# Patient Record
Sex: Female | Born: 1965 | Race: White | Hispanic: No | Marital: Married | Smoking: Former smoker
Health system: Southern US, Community
[De-identification: ages and names within clinical notes are randomized; demographics above are authoritative.]

## PROBLEM LIST (undated history)

## (undated) DIAGNOSIS — I10 Essential (primary) hypertension: Secondary | ICD-10-CM

## (undated) DIAGNOSIS — D61818 Other pancytopenia: Secondary | ICD-10-CM

## (undated) DIAGNOSIS — C9100 Acute lymphoblastic leukemia not having achieved remission: Secondary | ICD-10-CM

## (undated) HISTORY — DX: Other pancytopenia: D61.818

## (undated) HISTORY — DX: Essential (primary) hypertension: I10

## (undated) HISTORY — DX: Acute lymphoblastic leukemia not having achieved remission: C91.00

---

## 1998-07-20 ENCOUNTER — Other Ambulatory Visit: Admission: RE | Admit: 1998-07-20 | Discharge: 1998-07-20 | Payer: Self-pay | Admitting: Obstetrics and Gynecology

## 1999-06-30 ENCOUNTER — Other Ambulatory Visit: Admission: RE | Admit: 1999-06-30 | Discharge: 1999-06-30 | Payer: Self-pay | Admitting: Obstetrics and Gynecology

## 2000-12-30 ENCOUNTER — Other Ambulatory Visit: Admission: RE | Admit: 2000-12-30 | Discharge: 2000-12-30 | Payer: Self-pay | Admitting: Obstetrics and Gynecology

## 2002-01-05 ENCOUNTER — Other Ambulatory Visit: Admission: RE | Admit: 2002-01-05 | Discharge: 2002-01-05 | Payer: Self-pay | Admitting: Obstetrics and Gynecology

## 2004-02-14 ENCOUNTER — Other Ambulatory Visit: Admission: RE | Admit: 2004-02-14 | Discharge: 2004-02-14 | Payer: Self-pay | Admitting: Obstetrics and Gynecology

## 2005-04-17 ENCOUNTER — Other Ambulatory Visit: Admission: RE | Admit: 2005-04-17 | Discharge: 2005-04-17 | Payer: Self-pay | Admitting: Obstetrics and Gynecology

## 2005-09-14 ENCOUNTER — Ambulatory Visit (HOSPITAL_COMMUNITY): Admission: RE | Admit: 2005-09-14 | Discharge: 2005-09-14 | Payer: Self-pay | Admitting: Obstetrics and Gynecology

## 2006-04-18 ENCOUNTER — Other Ambulatory Visit: Admission: RE | Admit: 2006-04-18 | Discharge: 2006-04-18 | Payer: Self-pay | Admitting: Obstetrics and Gynecology

## 2006-09-16 ENCOUNTER — Ambulatory Visit (HOSPITAL_COMMUNITY): Admission: RE | Admit: 2006-09-16 | Discharge: 2006-09-16 | Payer: Self-pay | Admitting: Obstetrics and Gynecology

## 2007-09-30 ENCOUNTER — Ambulatory Visit (HOSPITAL_COMMUNITY): Admission: RE | Admit: 2007-09-30 | Discharge: 2007-09-30 | Payer: Self-pay | Admitting: Obstetrics and Gynecology

## 2008-08-03 ENCOUNTER — Other Ambulatory Visit: Admission: RE | Admit: 2008-08-03 | Discharge: 2008-08-03 | Payer: Self-pay | Admitting: Obstetrics and Gynecology

## 2009-08-04 ENCOUNTER — Other Ambulatory Visit: Admission: RE | Admit: 2009-08-04 | Discharge: 2009-08-04 | Payer: Self-pay | Admitting: Obstetrics and Gynecology

## 2009-09-02 ENCOUNTER — Ambulatory Visit (HOSPITAL_COMMUNITY): Admission: RE | Admit: 2009-09-02 | Discharge: 2009-09-02 | Payer: Self-pay | Admitting: Internal Medicine

## 2009-12-26 ENCOUNTER — Encounter: Admission: RE | Admit: 2009-12-26 | Discharge: 2009-12-26 | Payer: Self-pay | Admitting: General Practice

## 2010-09-15 ENCOUNTER — Ambulatory Visit (HOSPITAL_COMMUNITY): Admission: RE | Admit: 2010-09-15 | Discharge: 2010-09-15 | Payer: Self-pay | Admitting: Internal Medicine

## 2011-09-10 ENCOUNTER — Other Ambulatory Visit (HOSPITAL_COMMUNITY): Payer: Self-pay | Admitting: Internal Medicine

## 2011-09-10 DIAGNOSIS — Z1231 Encounter for screening mammogram for malignant neoplasm of breast: Secondary | ICD-10-CM

## 2011-10-10 ENCOUNTER — Ambulatory Visit (HOSPITAL_COMMUNITY)
Admission: RE | Admit: 2011-10-10 | Discharge: 2011-10-10 | Disposition: A | Discharge status: Home / Self Care | Payer: BC Managed Care – PPO | Source: Ambulatory Visit | Attending: Internal Medicine | Admitting: Internal Medicine

## 2011-10-10 DIAGNOSIS — Z1231 Encounter for screening mammogram for malignant neoplasm of breast: Secondary | ICD-10-CM | POA: Insufficient documentation

## 2012-07-06 ENCOUNTER — Emergency Department (HOSPITAL_COMMUNITY): Payer: BC Managed Care – PPO

## 2012-07-06 ENCOUNTER — Encounter (HOSPITAL_COMMUNITY): Payer: Self-pay | Admitting: Emergency Medicine

## 2012-07-06 ENCOUNTER — Emergency Department (HOSPITAL_COMMUNITY)
Admission: EM | Admit: 2012-07-06 | Discharge: 2012-07-06 | Disposition: A | Discharge status: Home / Self Care | Payer: BC Managed Care – PPO | Attending: Emergency Medicine | Admitting: Emergency Medicine

## 2012-07-06 DIAGNOSIS — Z87891 Personal history of nicotine dependence: Secondary | ICD-10-CM | POA: Insufficient documentation

## 2012-07-06 DIAGNOSIS — I10 Essential (primary) hypertension: Secondary | ICD-10-CM | POA: Insufficient documentation

## 2012-07-06 DIAGNOSIS — Z79899 Other long term (current) drug therapy: Secondary | ICD-10-CM | POA: Insufficient documentation

## 2012-07-06 DIAGNOSIS — R079 Chest pain, unspecified: Secondary | ICD-10-CM | POA: Insufficient documentation

## 2012-07-06 DIAGNOSIS — R791 Abnormal coagulation profile: Secondary | ICD-10-CM | POA: Insufficient documentation

## 2012-07-06 HISTORY — DX: Essential (primary) hypertension: I10

## 2012-07-06 LAB — BASIC METABOLIC PANEL
BUN: 13 mg/dL (ref 6–23)
CO2: 23 mEq/L (ref 19–32)
Calcium: 9.4 mg/dL (ref 8.4–10.5)
Creatinine, Ser: 0.73 mg/dL (ref 0.50–1.10)
GFR calc Af Amer: >90 mL/min (ref >90)
GFR calc non Af Amer: >90 mL/min (ref >90)
Potassium: 3.9 mEq/L (ref 3.5–5.1)
Sodium: 133 mEq/L — ABNORMAL LOW (ref 135–145)

## 2012-07-06 LAB — POCT I-STAT TROPONIN I: Troponin i, poc: 0 ng/mL (ref 0.00–0.08)

## 2012-07-06 LAB — CBC
HCT: 37.1 % (ref 36.0–46.0)
Hemoglobin: 13.3 g/dL (ref 12.0–15.0)
MCH: 31.5 pg (ref 26.0–34.0)
MCHC: 35.8 g/dL (ref 30.0–36.0)
MCV: 87.9 fL (ref 78.0–100.0)
Platelets: 108 10*3/uL — ABNORMAL LOW (ref 150–400)
RBC: 4.22 MIL/uL (ref 3.87–5.11)
RDW: 15 % (ref 11.5–15.5)

## 2012-07-06 LAB — D-DIMER, QUANTITATIVE: D-Dimer, Quant: 0.83 ug/mL-FEU — ABNORMAL HIGH (ref 0.00–0.48)

## 2012-07-06 MED ORDER — ASPIRIN 325 MG PO TABS
325.0000 mg | ORAL_TABLET | ORAL | Status: AC
Start: 2012-07-06 — End: 2012-07-06
  Administered 2012-07-06: 325 mg via ORAL
  Filled 2012-07-06: qty 1

## 2012-07-06 MED ORDER — OXYCODONE-ACETAMINOPHEN 5-325 MG PO TABS
1.0000 | ORAL_TABLET | Freq: Once | ORAL | Status: AC
  Administered 2012-07-06: 1 via ORAL
  Filled 2012-07-06: qty 1

## 2012-07-06 MED ORDER — KETOROLAC TROMETHAMINE 30 MG/ML IJ SOLN
30.0000 mg | Freq: Once | INTRAMUSCULAR | Status: AC
  Administered 2012-07-06: 30 mg via INTRAVENOUS
  Filled 2012-07-06: qty 1

## 2012-07-06 MED ORDER — ONDANSETRON HCL 4 MG/2ML IJ SOLN
4.0000 mg | Freq: Once | INTRAMUSCULAR | Status: AC
  Administered 2012-07-06: 4 mg via INTRAVENOUS

## 2012-07-06 MED ORDER — IOHEXOL 350 MG/ML SOLN
100.0000 mL | Freq: Once | INTRAVENOUS | Status: AC | PRN
  Administered 2012-07-06: 100 mL via INTRAVENOUS

## 2012-07-06 MED ORDER — SODIUM CHLORIDE 0.9 % IV BOLUS (SEPSIS)
1000.0000 mL | Freq: Once | INTRAVENOUS | Status: AC
  Administered 2012-07-06: 1000 mL via INTRAVENOUS

## 2012-07-06 MED ORDER — ONDANSETRON HCL 4 MG/2ML IJ SOLN
INTRAMUSCULAR | Status: AC
  Filled 2012-07-06: qty 2

## 2012-07-06 NOTE — ED Notes (Signed)
Pt states that last night around 11 pm, she went to bed and noticed that her chest felt uncomfortable, pt took a few motrin and went to bed but was woken up around 2 am with sharp pain across central chest, radiating to the back.  Pt had a similar pain in July of this year and pain lasted for a week and gradually got better.  Pt has HTN but no other cardiac hx.  Denies NV, SOB  Not diaphoretic.

## 2012-07-06 NOTE — ED Provider Notes (Signed)
History     CSN: [id number]  Arrival date & time 07/06/12  1220   First MD Initiated Contact with Patient 07/06/12 1317      Chief Complaint  Patient presents with  . Chest Pain    (Consider location/radiation/quality/duration/timing/severity/associated sxs/prior treatment) The history is provided by the patient.  Samantha Oconnor is a 46 y.o. female hx of HTN here with CP. CP started around 11pm last night, it felt band-like around her chest. Not pleuritic or exertional. She had a similar episode in July that resolved with motrin. She recently traveled to [place] by car but denies leg swelling or SOB. No fever or chills or cough. She had no cardiac workup in the past. Her only cardiac risk factor is HTN.    Past Medical History  Diagnosis Date  . Hypertension     History reviewed. No pertinent past surgical history.  History reviewed. No pertinent family history.  History  Substance Use Topics  . Smoking status: Former Smoker -- 18 years  . Smokeless tobacco: Never Used  . Alcohol Use: 3.0 oz/week    5 Glasses of wine per week    OB History    Grav Para Term Preterm Abortions TAB SAB Ect Mult Living                  Review of Systems  Cardiovascular: Positive for chest pain.    Allergies  Bactrim  Home Medications   Current Outpatient Rx  Name Route Sig Dispense Refill  . ALISKIREN FUMARATE 300 MG PO TABS Oral Take 300 mg by mouth daily.    Marland Kitchen HYDROCHLOROTHIAZIDE 25 MG PO TABS Oral Take 25 mg by mouth daily.      BP 140/91  Pulse 99  Temp 99 F (37.2 C) (Oral)  Resp 16  Ht 5\' 3"  (1.6 m)  Wt 171 lb (77.565 kg)  BMI 30.29 kg/m2  SpO2 97%  LMP 06/30/2012  Physical Exam  Nursing note and vitals reviewed. Constitutional: She is oriented to person, place, and time.       Uncomfortable.   HENT:  Head: Normocephalic.  Mouth/Throat: Oropharynx is clear and moist.  Eyes: Conjunctivae are normal. Pupils are equal, round, and reactive to light.    Neck: Normal range of motion. Neck supple.  Cardiovascular: Normal rate, regular rhythm and normal heart sounds.   Pulmonary/Chest: Effort normal and breath sounds normal. She has no wheezes. She has no rales.       ? Tenderness on R side of chest  Abdominal: Soft. Bowel sounds are normal.  Musculoskeletal: Normal range of motion. She exhibits no edema.       No calf tenderness  Neurological: She is alert and oriented to person, place, and time.  Skin: Skin is warm and dry.  Psychiatric: She has a normal mood and affect. Her behavior is normal. Judgment and thought content normal.    ED Course  Procedures (including critical care time)  Labs Reviewed  CBC - Abnormal; Notable for the following:    WBC 1.6 (*)     Platelets 108 (*)     All other components within normal limits  BASIC METABOLIC PANEL - Abnormal; Notable for the following:    Sodium 133 (*)     Chloride 95 (*)     Glucose, Bld 119 (*)     All other components within normal limits  D-DIMER, QUANTITATIVE - Abnormal; Notable for the following:    D-Dimer, Quant 0.83 (*)  All other components within normal limits  POCT I-STAT TROPONIN I   Dg Chest 2 View  07/06/2012  *RADIOLOGY REPORT*  Clinical Data: Chest pain.  CHEST - 2 VIEW  Comparison: Chest CT, same date.  Findings: The cardiac silhouette, mediastinal and hilar contours are normal.  The lungs are clear.  No pleural effusion.  The bony thorax is intact.  Moderate lower thoracic and lumbar scoliosis.  IMPRESSION: No acute cardiopulmonary findings.   Original Report Authenticated By: P. Loralie Champagne, M.D.    Ct Angio Chest Pe W/cm &/or Wo Cm  07/06/2012  *RADIOLOGY REPORT*  Clinical Data: Chest pain.  CT ANGIOGRAPHY CHEST  Technique:  Multidetector CT imaging of the chest using the standard protocol during bolus administration of intravenous contrast. Multiplanar reconstructed images including MIPs were obtained and reviewed to evaluate the vascular anatomy.   Contrast: OMNIPAQUE IOHEXOL 350 MG/ML SOLN  Comparison: None  Findings: The chest wall is unremarkable.  No breast masses, supraclavicular or axillary adenopathy.  Small scattered nodes are noted.  The bony thorax is intact.  No destructive bone lesions or spinal canal compromise. There are scattered sclerotic areas in the thoracic spine.  I think most of this is due to degenerative disc disease and degenerative facet disease with discogenic sclerosis and reactive changes.  Sclerotic metastatic lesions are felt to be unlikely.  The heart is normal in size.  No pericardial effusion.  No mediastinal or hilar adenopathy.  The aorta is normal in caliber. No dissection.  The esophagus is grossly normal.  The pulmonary arterial tree is fairly well opacified.  No definite filling defects to suggest pulmonary emboli.  Examination of the lung parenchyma demonstrates no acute pulmonary findings.  No infiltrates, edema or effusions.  Patchy areas of atelectasis are noted.  No worrisome masses or nodules.  The tracheobronchial tree is unremarkable.  The upper abdomen is unremarkable.  IMPRESSION:  1.  No CT findings for pulmonary embolism. 2.  Normal thoracic aorta. 3.  Scattered sclerotic bony changes in the thoracic spine likely due to disc disease and facet disease with endplate reactive changes and sclerosis. 4.  No acute pulmonary findings.   Original Report Authenticated By: P. Loralie Champagne, M.D.      1. Chest pain      Date: 07/06/2012  Rate: 116  Rhythm: sinus tachycardia  QRS Axis: normal  Intervals: normal  ST/T Wave abnormalities: normal  Conduction Disutrbances:none  Narrative Interpretation:   Old EKG Reviewed: none available    MDM  Samantha Oconnor is a 46 y.o. female hx of HTN here with atypical chest pain. Will consider ACS, but given recent travel and tachycardia, will need d-dimer to r/o PE. Will check cbc, bmp, trop x 2, cxr and reevaluate.    3:33 PM Patient feels well. Now  pain free. D-dimer elevated, CT PA showed no PE. Trop neg x 1. CXR nl. I signed out to Dr. Lynelle Doctor to f/u second troponin result.       Richardean Canal, MD 07/06/12 1534

## 2012-07-23 ENCOUNTER — Institutional Professional Consult (permissible substitution): Payer: BC Managed Care – PPO | Admitting: Cardiovascular Disease

## 2012-08-26 ENCOUNTER — Telehealth: Payer: Self-pay | Admitting: Oncology

## 2012-08-26 NOTE — Telephone Encounter (Signed)
LVOM for pt to return call in re referral from Dr. Renae Fickle.

## 2012-08-27 ENCOUNTER — Telehealth: Payer: Self-pay | Admitting: Oncology

## 2012-08-27 ENCOUNTER — Encounter: Payer: Self-pay | Admitting: Oncology

## 2012-08-27 ENCOUNTER — Ambulatory Visit: Payer: BC Managed Care – PPO

## 2012-08-27 ENCOUNTER — Other Ambulatory Visit (HOSPITAL_BASED_OUTPATIENT_CLINIC_OR_DEPARTMENT_OTHER): Payer: BC Managed Care – PPO | Admitting: Lab

## 2012-08-27 ENCOUNTER — Ambulatory Visit (HOSPITAL_BASED_OUTPATIENT_CLINIC_OR_DEPARTMENT_OTHER): Payer: BC Managed Care – PPO | Admitting: Oncology

## 2012-08-27 VITALS — BP 155/95 | HR 113 | Temp 98.0°F | Resp 20 | Ht 63.0 in | Wt 159.7 lb

## 2012-08-27 DIAGNOSIS — R599 Enlarged lymph nodes, unspecified: Secondary | ICD-10-CM

## 2012-08-27 DIAGNOSIS — D61818 Other pancytopenia: Secondary | ICD-10-CM

## 2012-08-27 DIAGNOSIS — R799 Abnormal finding of blood chemistry, unspecified: Secondary | ICD-10-CM

## 2012-08-27 DIAGNOSIS — I10 Essential (primary) hypertension: Secondary | ICD-10-CM

## 2012-08-27 DIAGNOSIS — M949 Disorder of cartilage, unspecified: Secondary | ICD-10-CM

## 2012-08-27 DIAGNOSIS — R948 Abnormal results of function studies of other organs and systems: Secondary | ICD-10-CM

## 2012-08-27 DIAGNOSIS — M899 Disorder of bone, unspecified: Secondary | ICD-10-CM

## 2012-08-27 HISTORY — DX: Other pancytopenia: D61.818

## 2012-08-27 HISTORY — DX: Essential (primary) hypertension: I10

## 2012-08-27 LAB — COMPREHENSIVE METABOLIC PANEL (CC13)
ALT: 9 U/L (ref 0–55)
AST: 10 U/L (ref 5–34)
Albumin: 3.6 g/dL (ref 3.5–5.0)
Alkaline Phosphatase: 72 U/L (ref 40–150)
Potassium: 3.6 mEq/L (ref 3.5–5.1)
Sodium: 139 mEq/L (ref 136–145)
Total Bilirubin: 0.51 mg/dL (ref 0.20–1.20)
Total Protein: 7 g/dL (ref 6.4–8.3)

## 2012-08-27 LAB — MANUAL DIFFERENTIAL
Band Neutrophils: 1 % (ref 0–10)
Basophil: 0 % (ref 0–2)
Blasts: 8 % — ABNORMAL HIGH (ref 0–0)
EOS: 0 % (ref 0–7)
LYMPH: 22 % (ref 14–49)
MONO: 0 % (ref 0–14)
Metamyelocytes: 2 % — ABNORMAL HIGH (ref 0–0)
nRBC: 7 % — ABNORMAL HIGH (ref 0–0)

## 2012-08-27 LAB — CBC WITH DIFFERENTIAL/PLATELET
MCHC: 34.6 g/dL (ref 31.5–36.0)
MCV: 96.4 fL (ref 79.5–101.0)
Platelets: 73 10*3/uL — ABNORMAL LOW (ref 145–400)
RBC: 2.65 10*6/uL — ABNORMAL LOW (ref 3.70–5.45)
RDW: 19 % — ABNORMAL HIGH (ref 11.2–14.5)
WBC: 1.5 10*3/uL — ABNORMAL LOW (ref 3.9–10.3)

## 2012-08-27 NOTE — Telephone Encounter (Deleted)
C/D 08/27/12 for appt 09/04/12

## 2012-08-27 NOTE — Telephone Encounter (Signed)
Called pt regarding BMBX tomorrow to be here at 0830am

## 2012-08-27 NOTE — Telephone Encounter (Signed)
C/D 08/27/12 for appt 08/27/12

## 2012-08-27 NOTE — Progress Notes (Signed)
Checked in new pt with no financial concerns. °

## 2012-08-27 NOTE — Patient Instructions (Signed)
Return at 8:30 AM tomorrow 10/31 for bone marrow biopsy MD visit to discuss results on Monday, Nov 4th at 3 PM

## 2012-08-27 NOTE — Progress Notes (Signed)
New Patient Hematology-Oncology Evaluation   Samantha Oconnor [id number] 11/04/1965 46 y.o. 08/27/2012  CC: Dr. Andi Devon; Dr. Doristine Section   Reason for referral: Unexplained bone pain, abnormal bone marrow signal on MRI of the spine, and pancytopenia   HPI:  Urgent work in evaluation for this 46 year old woman with  overall excellent health except for treated hypertension. Back in July of this year she developed rather acute onset of bilateral sternal chest pain. She took anti-inflammatory drugs for about a week and the pain resolved. In August she developed right sciatic pain while on a driving trip to [place] to visit family. The pain got so bad that she went to an emergency department. She took Motrin and prednisone to relieve her symptoms. On Labor Day weekend she woke up and again experienced diffuse pain across her anterior chest. She was evaluated in the [facility] long emergency department on September 8 where a chest x-ray was normal and CT scan of the chest was negative for any adenopathy, infiltrate, effusion, or pulmonary emboli. Electrocardiogram was normal. A CBC was done which was abnormal but not commented on. Hemoglobin was 13 hematocrit 37 white count 1600 no differential and platelet count 108,000. She was told to followup with a cardiologist. She felt her symptoms were more orthopedic in nature and set up an appointment to see Dr. Renae Fickle. Her initial visit was on September 11. 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 She was put on a program of steroids, bed rest, and moist heat. She was reevaluated on September 23 and was doing much better at that time. However at time of a followup visit on October 21, her pain had recurred and at that point an MRI of the spine was ordered. The study was done on October 26. Main finding was diffuse abnormality of bone marrow signal throughout the spine, sacrum, and  pelvis. Multiple prominent retroperitoneal and pelvic lymph nodes noted. Dr. Renae Fickle called me with these findings and asked if I would evaluate the patient for an underlying bone marrow disorder.  Blood counts have degenerated  compared with the values done in September. CBC in our office today  shows a hemoglobin of 8.8 hematocrit 25.6 MCV 96 total white count 1500, 67% neutrophils, 22% lymphocytes, in addition there are early forms reported 2% metamyelocytes 8% blasts and the presence of  nucleated red cells. I reviewed the peripheral blood film and confirmed these findings. The blasts are small with scant cytoplasm. There are teardrop red cell forms. Some abnormal platelet forms and I confirm the presence of nucleated red blood cells. Serum LDH is elevated at 384.  In addition to the symptoms above, she admits to profuse night sweats which started in September. She has had anorexia and a significant weight loss going from 177 pounds in September to 159 pounds today. She has noted some spontaneous bruising. Blood on the tissue when she blows her nose but no spontaneous ecchymosis, no gum bleeding, no melena, she has seen a small amount of blood on the toilet tissue but has been straining at stools due to constipation from when necessary narcotic analgesics. No hematuria. No skin rash. She has had intermittent pain on her scalp.  She is a nonsmoker. She did drink wine daily prior to this illness. She has no history of exposure to organic solvents, therapeutic dose radiation, or high-dose insecticides.  There is no family history of any blood disorder.   PMH: Past Medical History  Diagnosis Date  . Hypertension   No history of MI, ulcers, reflux, diabetes, hepatitis, yellow jaundice, malaria, mononucleosis, thyroid disease, seizure, stroke, blood clots.  No prior surgical procedures except for wisdom tooth extraction.  Allergies: Allergies  Allergen Reactions  . 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 Married. 2 twin sons aged 91 and just started college.  reports that she has quit smoking. She has never used smokeless tobacco. She reports that she drinks about 3 ounces of alcohol per week.  Family History: Father died at age 68 of esophageal cancer. Mother still alive and well at age 42. She has 3 sisters age 63, 19, and 55. 2 brothers age 47 and 53. All healthy.  Review of Systems: Constitutional symptoms: See above HEENT: No sore throat, no headache Respiratory: No cough or dyspnea Cardiovascular:  No ischemic type chest pain or palpitations Gastrointestinal ROS: Constipation from recent Percocet use.  Genito-Urinary ROS: No urinary tract symptoms. No hematuria Hematological and Lymphatic: Musculoskeletal: See above Neurologic: No headache, no change in vision, no focal weakness, no paresthesias Dermatologic: Some spontaneous bruising recently. No rash Remaining ROS negative.  Physical Exam: Blood pressure 155/95, pulse 113, temperature 98 F (36.7 C), temperature source Oral, resp. rate 20, height 5\' 3"  (1.6 m), weight 159 lb 11.2 oz (72.439 kg). Wt Readings from Last 3 Encounters:  08/27/12 159 lb 11.2 oz (72.439 kg)  07/06/12 171 lb (77.565 kg)    General appearance: Well-nourished Caucasian woman Head: Normal Neck: Full range of motion Lymph nodes: No cervical, supraclavicular, axillary, or inguinal lymphadenopathy Breasts: Not examined. Last mammogram November 2012 was normal Lungs: Clear to auscultation resonant to percussion Heart: Regular rhythm no murmur Abdominal: Soft, nontender, no mass, no organomegaly and specifically no splenomegaly even when examined in the left lateral decubitus position GU: Not examined Extremities: No edema, no calf  tenderness Neurologic: Mental status intact, cranial nerves intact, PERRLA, optic discs sharp, vessels normal, no hemorrhage or exudate, motor strength was 5 over 5, reflexes 2+ symmetric, coordination normal, sensation mildly decreased to vibration over the fingertips by tuning fork exam Skin: No rash or ecchymosis    Lab Results: Lab Results  Component Value Date   WBC 1.5* 08/27/2012   HGB 8.8* 08/27/2012   HCT 25.6* 08/27/2012   MCV 96.4 08/27/2012   PLT 73* 08/27/2012     Chemistry      Component Value Date/Time   NA 139 08/27/2012 1043   NA 133* 07/06/2012 1239   K 3.6 08/27/2012 1043   K 3.9 07/06/2012 1239   CL 100 08/27/2012 1043   CL 95* 07/06/2012 1239   CO2 25 08/27/2012 1043   CO2 23 07/06/2012 1239   BUN 30.0* 08/27/2012 1043   BUN 13 07/06/2012 1239   CREATININE 0.9 08/27/2012 1043   CREATININE 0.73 07/06/2012 1239      Component Value Date/Time   CALCIUM 9.2 08/27/2012 1043   CALCIUM 9.4 07/06/2012 1239   ALKPHOS 72 08/27/2012 1043   AST 10 08/27/2012 1043   ALT 9 08/27/2012 1043   BILITOT 0.51 08/27/2012 1043    LDH 384   Review of peripheral blood film: See history of present illness   Radiological Studies: See discussion above   Impression and Plan: 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  I will do a bone marrow aspiration and biopsy tomorrow and then have her back for discussion of results and outline a treatment plan once we establish a diagnosis. She may need CT scan of the abdomen and pelvis but I would like to wait for the bone marrow findings first before ordering this.      Levert Feinstein, MD 08/27/2012, 2:13 PM

## 2012-08-27 NOTE — Telephone Encounter (Signed)
Gave pt appt for October BMBX and November MD visit with lab

## 2012-08-28 ENCOUNTER — Other Ambulatory Visit: Payer: Self-pay | Admitting: *Deleted

## 2012-08-28 ENCOUNTER — Other Ambulatory Visit (HOSPITAL_COMMUNITY)
Admission: RE | Admit: 2012-08-28 | Discharge: 2012-08-28 | Disposition: A | Discharge status: Home / Self Care | Payer: BC Managed Care – PPO | Source: Ambulatory Visit | Attending: Oncology | Admitting: Oncology

## 2012-08-28 ENCOUNTER — Ambulatory Visit: Payer: BC Managed Care – PPO

## 2012-08-28 ENCOUNTER — Encounter (HOSPITAL_BASED_OUTPATIENT_CLINIC_OR_DEPARTMENT_OTHER): Payer: BC Managed Care – PPO | Admitting: Oncology

## 2012-08-28 ENCOUNTER — Ambulatory Visit: Payer: BC Managed Care – PPO | Admitting: Oncology

## 2012-08-28 ENCOUNTER — Other Ambulatory Visit: Payer: BC Managed Care – PPO | Admitting: Lab

## 2012-08-28 DIAGNOSIS — R52 Pain, unspecified: Secondary | ICD-10-CM

## 2012-08-28 DIAGNOSIS — D61818 Other pancytopenia: Secondary | ICD-10-CM | POA: Insufficient documentation

## 2012-08-28 MED ORDER — OXYCODONE HCL 5 MG PO CAPS: 5.0000 mg | ORAL_CAPSULE | ORAL | Status: AC | PRN

## 2012-08-28 NOTE — Patient Instructions (Addendum)
[facility] Discharge Instructions for Post Bone Marrow Procedure  Today you had a bone marrow biopsy and aspirate   Please keep the pressure dressing in place for at least 24 hours.  Have someone check your dressing periodically for bleeding.  If needed you can reapply a pressure dressing to the site.  Take pain medication as directed.  IF BLEEDING REOCCURS THAT SHOULD BE REPORTED IMMEDIATELY. Call the Cancer Center at [phone] if during business hours. Or report to the Emergency Room.   I have been informed and understand all the instructions given to me. I know to contact the clinic, my physician, or go to the Emergency Department if any problems should occur. I do not have any questions at this time, but understand that I may call the clinic during office hours at [phone]  should I have any questions or need assistance in obtaining follow up care.    __________________________________________  _____________  __________ Signature of Patient or Authorized Representative            Date                   Time    __________________________________________ Nurse's Signature

## 2012-08-28 NOTE — Progress Notes (Unsigned)
Pt. Informed of Bone Marrow Biopsy & Aspiration Procedure & consent signed @ 9am. VS obtained.  Pt. Allowed to ask questions.  She is slightly sedated from ativan & husband witnessed the consent. Bone Marrow Biopsy & Aspiration procedure completed by Dr Cyndie Chime 0930.  Pt. [place].  Dsg dry & intact.  Pt rested & still sedated from ativan but alert & oriented.  Instructions given to pt & husband.  Pain med-oxycodone script given to pt & encouraged to hold motrin due to low platelets.  She is concerned with constipation & instructed to take a stool softener with each pain pill.  Dsg d & i to sacral area.  Pt left ambulatory with husband holding her arm @ 1010am.

## 2012-08-28 NOTE — Progress Notes (Signed)
Bone Marrow Biopsy and Aspiration Procedure Note   Informed consent was obtained and potential risks including bleeding, infection and pain were reviewed with the patient.  Posterior iliac crest(s) prepped with Betadine.  Lidocaine 2% local anesthesia infiltrated into the subcutaneous tissue. Premedication: Lorazepam 2 mg by mouth  Left posterior iliac crest bone marrow aspiration attempted x2 but did not reveal any liquid marrow. 2 large core biopsies were obtained. One core will be processed for flow cytometry and cytogenetics. Of note, the first core biopsy was white suggesting either diffuse infiltration or myelofibrosis.  The procedure was tolerated well and there were no complications.  Specimens sent for: routine histopathologic stains and sectioning, flow cytometry and cytogenetics  Indication: Diffuse bone pain, night sweats, and rapidly progressive pancytopenia with presence of blasts and nucleated red blood cells on review of the peripheral blood film.  Physician: Levert Feinstein

## 2012-08-29 ENCOUNTER — Telehealth: Payer: Self-pay | Admitting: *Deleted

## 2012-08-29 LAB — IMMUNOFIXATION ELECTROPHORESIS
IgG (Immunoglobin G), Serum: 883 mg/dL (ref 690–1700)
Total Protein, Serum Electrophoresis: 6.8 g/dL (ref 6.0–8.3)

## 2012-08-29 LAB — KAPPA/LAMBDA LIGHT CHAINS
Kappa free light chain: 0.64 mg/dL (ref 0.33–1.94)
Kappa:Lambda Ratio: 0.83 (ref 0.26–1.65)

## 2012-08-29 NOTE — Telephone Encounter (Signed)
Pt's husband called & reports that pt was prescribed prednisone by Dr. Renae Fickle for leg pain & pt has been taking through OCT. & has @ 4 days left & wants to know if this is OK to cont since not seen on med list. He reports that she thinks this helps her leg pain.  Instructed to call pt at home [phone] or cell [phone].

## 2012-08-29 NOTE — Telephone Encounter (Signed)
Informed pt that Dr Cyndie Chime said it is OK to finish prednisone & reminded that she also has rx from yest for oxycodone if she needs it.  She doesn't really want to take this.

## 2012-09-01 ENCOUNTER — Ambulatory Visit (HOSPITAL_BASED_OUTPATIENT_CLINIC_OR_DEPARTMENT_OTHER): Payer: BC Managed Care – PPO | Admitting: Oncology

## 2012-09-01 ENCOUNTER — Other Ambulatory Visit (HOSPITAL_BASED_OUTPATIENT_CLINIC_OR_DEPARTMENT_OTHER): Payer: BC Managed Care – PPO | Admitting: Lab

## 2012-09-01 VITALS — BP 165/101 | HR 115 | Temp 99.1°F | Resp 20 | Ht 63.0 in | Wt 160.0 lb

## 2012-09-01 DIAGNOSIS — C9100 Acute lymphoblastic leukemia not having achieved remission: Secondary | ICD-10-CM

## 2012-09-01 DIAGNOSIS — D61818 Other pancytopenia: Secondary | ICD-10-CM

## 2012-09-01 NOTE — Progress Notes (Signed)
Samantha Oconnor returns today with her husband to review results of bone marrow biopsy that I did on Thursday, October 31. Bone marrow is 100% cellular with solid sheets of small blasts. I was unable to obtain an aspirate at time of the procedure. However I took an extra core biopsy and processed for flow cytometry and cytogenetics. Immunophenotype now available. 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  Diagnosis and treatment options discussed with the patient. I have been in touch with my colleague Prof. Miachel Roux at  [facility],  [facility] in [place] who is currently attending on the leukemia service. I feel that the patient would be best served by further evaluation and treatment at a [facility]. She is agreeable. We will Fed-Ex bone marrow slides. She will be admitted tomorrow AM.

## 2012-09-02 ENCOUNTER — Encounter: Payer: Self-pay | Admitting: Oncology

## 2012-09-02 DIAGNOSIS — C9100 Acute lymphoblastic leukemia not having achieved remission: Secondary | ICD-10-CM | POA: Insufficient documentation

## 2012-09-02 HISTORY — DX: Acute lymphoblastic leukemia not having achieved remission: C91.00

## 2012-09-03 ENCOUNTER — Telehealth: Payer: Self-pay | Admitting: *Deleted

## 2012-09-03 NOTE — Telephone Encounter (Signed)
Records faxed to Dr. Miachel Roux yest & today per Dr. Cyndie Chime @ [phone].

## 2012-09-05 LAB — HIV ANTIBODY (ROUTINE TESTING W REFLEX): HIV: NONREACTIVE

## 2012-09-05 LAB — EPSTEIN-BARR VIRUS VCA, IGM: EBV VCA IgM: 13.7 U/mL (ref <36.0)

## 2012-09-05 LAB — HEPATITIS PANEL, ACUTE
HCV Ab: NEGATIVE
Hep A IgM: NEGATIVE

## 2012-09-05 LAB — CMV IGM: CMV IgM: 0.59 (ref <0.90)

## 2012-09-16 ENCOUNTER — Other Ambulatory Visit: Payer: Self-pay | Admitting: *Deleted

## 2012-09-16 MED ORDER — LORAZEPAM 2 MG PO TABS: 2.0000 mg | ORAL_TABLET | Freq: Four times a day (QID) | ORAL | Status: AC | PRN

## 2012-09-30 ENCOUNTER — Telehealth: Payer: Self-pay | Admitting: Oncology

## 2012-09-30 ENCOUNTER — Other Ambulatory Visit: Payer: Self-pay | Admitting: *Deleted

## 2012-09-30 ENCOUNTER — Other Ambulatory Visit: Payer: Self-pay | Admitting: Oncology

## 2012-09-30 DIAGNOSIS — C9100 Acute lymphoblastic leukemia not having achieved remission: Secondary | ICD-10-CM

## 2012-09-30 NOTE — Telephone Encounter (Signed)
Pt called today re being d/c from [facility] and needing lbs here. pof sent today re standing lab order. Lb appts scheduled Q mon/thurs (8). Pt given next appt for 12/5 and will get schedule when she comes in. Waiting for confirmation from [facility] re if pt is to be seen by him 10/01/12

## 2012-10-01 ENCOUNTER — Other Ambulatory Visit: Payer: Self-pay | Admitting: *Deleted

## 2012-10-01 ENCOUNTER — Telehealth: Payer: Self-pay | Admitting: Oncology

## 2012-10-01 ENCOUNTER — Ambulatory Visit (HOSPITAL_BASED_OUTPATIENT_CLINIC_OR_DEPARTMENT_OTHER): Payer: BC Managed Care – PPO | Admitting: Oncology

## 2012-10-01 VITALS — BP 146/101 | HR 97 | Temp 98.4°F | Resp 20 | Ht 63.0 in | Wt 143.6 lb

## 2012-10-01 DIAGNOSIS — C9100 Acute lymphoblastic leukemia not having achieved remission: Secondary | ICD-10-CM

## 2012-10-01 DIAGNOSIS — I10 Essential (primary) hypertension: Secondary | ICD-10-CM

## 2012-10-01 DIAGNOSIS — T380X5A Adverse effect of glucocorticoids and synthetic analogues, initial encounter: Secondary | ICD-10-CM

## 2012-10-01 DIAGNOSIS — E139 Other specified diabetes mellitus without complications: Secondary | ICD-10-CM

## 2012-10-01 NOTE — Progress Notes (Signed)
Hematology and Oncology Follow Up Visit  KYNSLEE BAHAM [id number] 1966-02-27 46 y.o. 10/01/2012 1:22 PM   Principle Diagnosis: Encounter Diagnosis  Name Primary?  . ALL (acute lymphocytic leukemia) Yes     Interim History:   Followup visit for this pleasant 46 year old woman who presented with the indolent onset of atypical chest and right sciatic pain and was found to be pancytopenic with the presence of blasts on the peripheral blood film. Blasts  appeared small and lymphoid. She had no lymphadenopathy or organomegaly on exam. A CT scan of the chest done to rule out pulmonary emboli and did not show any adenopathy in the chest neck or axillae. An MRI of the spine done to evaluate the right sciatic pain suggested a small intra-abdominal/pelvic lymph nodes. I did a bone marrow aspiration and biopsy the day after her initial visit here on 08/28/2012. I personally reviewed the slides with the pathologist. Marrow was 100% cellular and packed with blasts. 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  She was referred on an urgent basis to [facility] in [place] where she underwent induction chemotherapy. I don't have the details of the program at time of this dictation but I assume that she got a standard program including daunorubicin, vincristine, prednisone, and L-asparaginase. She did very well. She had no major complications. A left PIC catheter was used to administer the treatment and removed at the time of discharge this Monday, December 2. She did receive one dose of intrathecal chemotherapy when a lumbar puncture was done to evaluate change in her vision. She was told that her blood sugars rose to over 400. She was sent home on Glucophage 500 mg daily. Glucose at the time of discharge on December 2 was coming down and was 154. She had no major infectious complications and no bleeding complications.  She maintained a good  appetite throughout the treatment program and did not lose any weight. She states that her vision today is normal. She denies any headache, no dyspnea. Bowel movements are regular. No diarrhea. She was put on an oral contraceptive. It is time for her menstrual cycle but she is only getting some low level spotting.  Of note is her CBC at discharge December 2 with hemoglobin 10.4, hematocrit 30.5, total white count 1300, 72% neutrophils, 12% lymphocytes, and platelet count 211,000.  A bone marrow aspiration and biopsy will be done at time of readmission to the hospital for her first consolidation course on December 17.   Medications: reviewed  Allergies:  Allergies  Allergen Reactions  . Bactrim (Sulfamethoxazole W-Trimethoprim)     Rash     Review of Systems: Constitutional:   No constitutional symptoms Respiratory: No cough or dyspnea Cardiovascular:  No chest pain Gastrointestinal: No abdominal pain. Normal bowel habit Genito-Urinary: See above Musculoskeletal: No muscle or bone pain Neurologic: See above; she has had some mild distal paresthesias Skin: No rash Remaining ROS negative.  Physical Exam: Blood pressure 146/101, pulse 97, temperature 98.4 F (36.9 C), temperature source Oral, resp. rate 20, height 5\' 3"  (1.6 m), weight 143 lb 9.6 oz (65.137 kg). Wt Readings from Last 3 Encounters:  10/01/12 143 lb 9.6 oz (65.137 kg)  09/01/12 160 lb (72.576 kg)  08/27/12 159 lb 11.2 oz (72.439 kg)     General appearance: Well-nourished Caucasian woman HENNT: Total alopecia. Pharynx no erythema, exudate, or ulcer Lymph nodes: No adenopathy Breasts: Lungs: Clear to auscultation resonant to percussion Heart: Regular rhythm no murmur Abdomen:  Soft, nontender, no mass, no organomegaly Extremities: No edema, no calf tenderness Vascular: No cyanosis Neurologic: Mental status intact, cranial nerves intact, motor strength 5 over 5, reflexes 1+ symmetric, sensation is mildly  decreased to vibration by tuning fork exam over the fingertips Skin: No rash or ecchymosis  Lab Results: Lab Results  Component Value Date   WBC 1.5* 08/27/2012   HGB 8.8* 08/27/2012   HCT 25.6* 08/27/2012   MCV 96.4 08/27/2012   PLT 73* 08/27/2012     Chemistry      Component Value Date/Time   NA 139 08/27/2012 1043   NA 133* 07/06/2012 1239   K 3.6 08/27/2012 1043   K 3.9 07/06/2012 1239   CL 100 08/27/2012 1043   CL 95* 07/06/2012 1239   CO2 25 08/27/2012 1043   CO2 23 07/06/2012 1239   BUN 30.0* 08/27/2012 1043   BUN 13 07/06/2012 1239   CREATININE 0.9 08/27/2012 1043   CREATININE 0.73 07/06/2012 1239      Component Value Date/Time   CALCIUM 9.2 08/27/2012 1043   CALCIUM 9.4 07/06/2012 1239   ALKPHOS 72 08/27/2012 1043   AST 10 08/27/2012 1043   ALT 9 08/27/2012 1043   BILITOT 0.51 08/27/2012 1043       Impression and Plan: #1. Adult acute lymphocytic leukemia She is status post induction chemotherapy and likely in remission. She will now go on to consolidation and subsequently a maintenance phase. We'll check blood counts here on Mondays and Thursdays. Transfuse blood and platelets as needed. All blood products irradiated and leuko-reduced. [facility] parameters are to give platelets if count is 20,000 or less and blood if hemoglobin is 10 her last period  #2. Essential hypertension  #3. Steroid-induced hyperglycemia now on Glucophage 500 mg daily   CC:. Dr. Andi Devon; Dr. Doristine Section; Dr. Miachel [facility] [address], [place]   Levert Feinstein, MD 12/4/20131:22 PM

## 2012-10-01 NOTE — Patient Instructions (Signed)
Labs on Mondays and Thursdays when not at [facility] Visit with Dr Reece Agar 10/28/12 @ 10 AM

## 2012-10-01 NOTE — Telephone Encounter (Signed)
gv pt appt schedule for December 2013 and January 2014. F/u appt scheduled for 10/31/12 instead of 10/28/12 due to JG out on vac.

## 2012-10-02 ENCOUNTER — Other Ambulatory Visit (HOSPITAL_BASED_OUTPATIENT_CLINIC_OR_DEPARTMENT_OTHER): Payer: BC Managed Care – PPO

## 2012-10-02 DIAGNOSIS — C9100 Acute lymphoblastic leukemia not having achieved remission: Secondary | ICD-10-CM

## 2012-10-02 LAB — COMPREHENSIVE METABOLIC PANEL (CC13)
ALT: 43 U/L (ref 0–55)
AST: 16 U/L (ref 5–34)
Alkaline Phosphatase: 51 U/L (ref 40–150)
BUN: 12 mg/dL (ref 7.0–26.0)
Calcium: 8.8 mg/dL (ref 8.4–10.4)
Creatinine: 0.8 mg/dL (ref 0.6–1.1)
Total Bilirubin: 0.56 mg/dL (ref 0.20–1.20)

## 2012-10-02 LAB — CBC WITH DIFFERENTIAL/PLATELET
BASO%: 0.2 % (ref 0.0–2.0)
EOS%: 0 % (ref 0.0–7.0)
HCT: 27 % — ABNORMAL LOW (ref 34.8–46.6)
LYMPH%: 2.6 % — ABNORMAL LOW (ref 14.0–49.7)
MCH: 30.7 pg (ref 25.1–34.0)
MCHC: 34.6 g/dL (ref 31.5–36.0)
MONO#: 0.8 10*3/uL (ref 0.1–0.9)
MONO%: 20.1 % — ABNORMAL HIGH (ref 0.0–14.0)
NEUT%: 77.1 % — ABNORMAL HIGH (ref 38.4–76.8)
Platelets: 372 10*3/uL (ref 145–400)
RBC: 3.04 10*6/uL — ABNORMAL LOW (ref 3.70–5.45)
WBC: 3.8 10*3/uL — ABNORMAL LOW (ref 3.9–10.3)

## 2012-10-02 LAB — FIBRINOGEN: Fibrinogen: 541 mg/dL — ABNORMAL HIGH (ref 204–475)

## 2012-10-06 ENCOUNTER — Other Ambulatory Visit (HOSPITAL_BASED_OUTPATIENT_CLINIC_OR_DEPARTMENT_OTHER): Payer: BC Managed Care – PPO

## 2012-10-06 DIAGNOSIS — C9100 Acute lymphoblastic leukemia not having achieved remission: Secondary | ICD-10-CM

## 2012-10-06 LAB — COMPREHENSIVE METABOLIC PANEL (CC13)
AST: 9 U/L (ref 5–34)
Albumin: 3 g/dL — ABNORMAL LOW (ref 3.5–5.0)
Alkaline Phosphatase: 48 U/L (ref 40–150)
BUN: 14 mg/dL (ref 7.0–26.0)
Calcium: 8.6 mg/dL (ref 8.4–10.4)
Chloride: 108 mEq/L — ABNORMAL HIGH (ref 98–107)
Creatinine: 0.8 mg/dL (ref 0.6–1.1)
Glucose: 179 mg/dl — ABNORMAL HIGH (ref 70–99)
Potassium: 3.8 mEq/L (ref 3.5–5.1)

## 2012-10-06 LAB — CBC WITH DIFFERENTIAL/PLATELET
Basophils Absolute: 0 10*3/uL (ref 0.0–0.1)
EOS%: 0 % (ref 0.0–7.0)
Eosinophils Absolute: 0 10*3/uL (ref 0.0–0.5)
HCT: 28.3 % — ABNORMAL LOW (ref 34.8–46.6)
HGB: 9.1 g/dL — ABNORMAL LOW (ref 11.6–15.9)
MCH: 29.4 pg (ref 25.1–34.0)
MCV: 91.3 fL (ref 79.5–101.0)
MONO%: 13 % (ref 0.0–14.0)
NEUT#: 3.9 10*3/uL (ref 1.5–6.5)
NEUT%: 82.5 % — ABNORMAL HIGH (ref 38.4–76.8)
RDW: 18.4 % — ABNORMAL HIGH (ref 11.2–14.5)

## 2012-10-09 ENCOUNTER — Other Ambulatory Visit: Payer: BC Managed Care – PPO

## 2012-10-13 ENCOUNTER — Other Ambulatory Visit: Payer: BC Managed Care – PPO

## 2012-10-15 ENCOUNTER — Telehealth: Payer: Self-pay | Admitting: Oncology

## 2012-10-15 NOTE — Telephone Encounter (Signed)
Pt called cancelling appt for 10/16/12 per pt rqst, pt is hospital. Nurse notified

## 2012-10-16 ENCOUNTER — Other Ambulatory Visit: Payer: BC Managed Care – PPO | Admitting: Lab

## 2012-10-17 ENCOUNTER — Telehealth: Payer: Self-pay | Admitting: Oncology

## 2012-10-17 NOTE — Telephone Encounter (Signed)
Pt's husband called, wants labs for Monday 12/23 and 12/26 cancelled, pt having labs @ [facility]

## 2012-10-20 ENCOUNTER — Other Ambulatory Visit: Payer: BC Managed Care – PPO

## 2012-10-23 ENCOUNTER — Other Ambulatory Visit: Payer: BC Managed Care – PPO

## 2012-10-24 ENCOUNTER — Telehealth: Payer: Self-pay | Admitting: Oncology

## 2012-10-24 NOTE — Telephone Encounter (Signed)
Pt called and wants to cancel labs for 12/30 and 10/30/12 , she is getting labs in [facility] and aware that she is seeing Dr. Cyndie Chime on 10/31/12, nurse notified

## 2012-10-27 ENCOUNTER — Other Ambulatory Visit: Payer: BC Managed Care – PPO

## 2012-10-28 ENCOUNTER — Telehealth: Payer: Self-pay | Admitting: Oncology

## 2012-10-28 NOTE — Telephone Encounter (Signed)
Pt called and wants to cancel all appts, including MD visit on 10/31/12, nurse notified

## 2012-10-30 ENCOUNTER — Other Ambulatory Visit: Payer: BC Managed Care – PPO

## 2012-10-31 ENCOUNTER — Ambulatory Visit: Payer: BC Managed Care – PPO | Admitting: Oncology

## 2012-11-03 ENCOUNTER — Other Ambulatory Visit: Payer: BC Managed Care – PPO | Admitting: Lab

## 2012-11-06 ENCOUNTER — Other Ambulatory Visit: Payer: BC Managed Care – PPO

## 2012-11-10 ENCOUNTER — Other Ambulatory Visit: Payer: BC Managed Care – PPO

## 2012-11-13 ENCOUNTER — Other Ambulatory Visit: Payer: BC Managed Care – PPO

## 2012-12-13 ENCOUNTER — Other Ambulatory Visit: Payer: Self-pay

## 2013-03-05 ENCOUNTER — Other Ambulatory Visit: Payer: Self-pay | Admitting: *Deleted

## 2013-03-05 ENCOUNTER — Telehealth: Payer: Self-pay | Admitting: Oncology

## 2013-03-05 DIAGNOSIS — C9100 Acute lymphoblastic leukemia not having achieved remission: Secondary | ICD-10-CM

## 2013-03-05 NOTE — Telephone Encounter (Signed)
Ptr scheduled for lab on 5/13 per [facility], POF from [facility], gave appt date and time to [facility] nurse

## 2013-03-05 NOTE — Progress Notes (Signed)
Received call from [facility] RN/[facility] requesting pt have labs done at our office tues 03/10/13.  POF done & scheduler/Rose notified.

## 2013-03-10 ENCOUNTER — Other Ambulatory Visit: Payer: BC Managed Care – PPO

## 2013-09-03 ENCOUNTER — Other Ambulatory Visit: Payer: Self-pay

## 2013-11-26 ENCOUNTER — Other Ambulatory Visit (HOSPITAL_COMMUNITY): Payer: Self-pay | Admitting: Internal Medicine

## 2013-11-26 DIAGNOSIS — Z1231 Encounter for screening mammogram for malignant neoplasm of breast: Secondary | ICD-10-CM

## 2013-11-30 ENCOUNTER — Ambulatory Visit (HOSPITAL_COMMUNITY)
Admission: RE | Admit: 2013-11-30 | Discharge: 2013-11-30 | Disposition: A | Discharge status: Home / Self Care | Payer: BC Managed Care – PPO | Source: Ambulatory Visit | Attending: Internal Medicine | Admitting: Internal Medicine

## 2013-11-30 DIAGNOSIS — Z1231 Encounter for screening mammogram for malignant neoplasm of breast: Secondary | ICD-10-CM | POA: Insufficient documentation

## 2013-12-26 ENCOUNTER — Telehealth: Payer: Self-pay | Admitting: *Deleted

## 2013-12-26 ENCOUNTER — Encounter: Payer: Self-pay | Admitting: *Deleted

## 2013-12-26 NOTE — Telephone Encounter (Signed)
Former pt of Dr. Beryle Beams( pt not active) letter printed

## 2014-06-10 ENCOUNTER — Other Ambulatory Visit: Payer: Self-pay | Admitting: Orthopedic Surgery

## 2014-06-10 DIAGNOSIS — M545 Low back pain, unspecified: Secondary | ICD-10-CM

## 2014-06-17 ENCOUNTER — Ambulatory Visit
Admission: RE | Admit: 2014-06-17 | Discharge: 2014-06-17 | Disposition: A | Discharge status: Home / Self Care | Payer: BC Managed Care – PPO | Source: Ambulatory Visit | Attending: Orthopedic Surgery | Admitting: Orthopedic Surgery

## 2014-06-17 DIAGNOSIS — M545 Low back pain, unspecified: Secondary | ICD-10-CM

## 2014-06-17 MED ORDER — GADOBENATE DIMEGLUMINE 529 MG/ML IV SOLN
16.0000 mL | Freq: Once | INTRAVENOUS | Status: AC | PRN
  Administered 2014-06-17: 16 mL via INTRAVENOUS

## 2014-10-01 ENCOUNTER — Other Ambulatory Visit: Payer: Self-pay | Admitting: Dermatology

## 2014-10-18 ENCOUNTER — Other Ambulatory Visit: Payer: Self-pay | Admitting: Dermatology

## 2014-12-15 ENCOUNTER — Other Ambulatory Visit (HOSPITAL_COMMUNITY): Payer: Self-pay | Admitting: Internal Medicine

## 2014-12-15 DIAGNOSIS — Z1231 Encounter for screening mammogram for malignant neoplasm of breast: Secondary | ICD-10-CM

## 2014-12-20 ENCOUNTER — Ambulatory Visit (HOSPITAL_COMMUNITY)
Admission: RE | Admit: 2014-12-20 | Discharge: 2014-12-20 | Disposition: A | Discharge status: Home / Self Care | Payer: BLUE CROSS/BLUE SHIELD | Source: Ambulatory Visit | Attending: Internal Medicine | Admitting: Internal Medicine

## 2014-12-20 ENCOUNTER — Ambulatory Visit (HOSPITAL_COMMUNITY): Payer: Self-pay

## 2014-12-20 DIAGNOSIS — Z1231 Encounter for screening mammogram for malignant neoplasm of breast: Secondary | ICD-10-CM

## 2016-02-29 ENCOUNTER — Other Ambulatory Visit: Payer: Self-pay

## 2016-02-29 DIAGNOSIS — Z1231 Encounter for screening mammogram for malignant neoplasm of breast: Secondary | ICD-10-CM

## 2016-03-01 ENCOUNTER — Other Ambulatory Visit (HOSPITAL_COMMUNITY)
Admission: RE | Admit: 2016-03-01 | Discharge: 2016-03-01 | Disposition: A | Discharge status: Home / Self Care | Payer: BLUE CROSS/BLUE SHIELD | Source: Ambulatory Visit | Attending: Family Medicine | Admitting: Family Medicine

## 2016-03-01 ENCOUNTER — Other Ambulatory Visit: Payer: Self-pay | Admitting: Family Medicine

## 2016-03-01 DIAGNOSIS — Z0000 Encounter for general adult medical examination without abnormal findings: Secondary | ICD-10-CM | POA: Diagnosis not present

## 2016-03-01 DIAGNOSIS — I10 Essential (primary) hypertension: Secondary | ICD-10-CM | POA: Diagnosis not present

## 2016-03-01 DIAGNOSIS — Z1151 Encounter for screening for human papillomavirus (HPV): Secondary | ICD-10-CM | POA: Insufficient documentation

## 2016-03-01 DIAGNOSIS — Z13220 Encounter for screening for lipoid disorders: Secondary | ICD-10-CM | POA: Diagnosis not present

## 2016-03-01 DIAGNOSIS — Z124 Encounter for screening for malignant neoplasm of cervix: Secondary | ICD-10-CM | POA: Insufficient documentation

## 2016-03-01 DIAGNOSIS — Z713 Dietary counseling and surveillance: Secondary | ICD-10-CM | POA: Diagnosis not present

## 2016-03-01 DIAGNOSIS — E6609 Other obesity due to excess calories: Secondary | ICD-10-CM | POA: Diagnosis not present

## 2016-03-02 LAB — CYTOLOGY - PAP

## 2016-03-13 DIAGNOSIS — C9101 Acute lymphoblastic leukemia, in remission: Secondary | ICD-10-CM | POA: Diagnosis not present

## 2016-03-16 DIAGNOSIS — C9101 Acute lymphoblastic leukemia, in remission: Secondary | ICD-10-CM | POA: Diagnosis not present

## 2016-03-23 ENCOUNTER — Ambulatory Visit
Admission: RE | Admit: 2016-03-23 | Discharge: 2016-03-23 | Disposition: A | Discharge status: Home / Self Care | Payer: BLUE CROSS/BLUE SHIELD | Source: Ambulatory Visit

## 2016-03-23 DIAGNOSIS — Z1231 Encounter for screening mammogram for malignant neoplasm of breast: Secondary | ICD-10-CM | POA: Diagnosis not present

## 2016-04-03 DIAGNOSIS — H04123 Dry eye syndrome of bilateral lacrimal glands: Secondary | ICD-10-CM | POA: Diagnosis not present

## 2016-04-03 DIAGNOSIS — H01002 Unspecified blepharitis right lower eyelid: Secondary | ICD-10-CM | POA: Diagnosis not present

## 2016-04-03 DIAGNOSIS — H01001 Unspecified blepharitis right upper eyelid: Secondary | ICD-10-CM | POA: Diagnosis not present

## 2016-04-03 DIAGNOSIS — H01004 Unspecified blepharitis left upper eyelid: Secondary | ICD-10-CM | POA: Diagnosis not present

## 2016-06-15 DIAGNOSIS — I10 Essential (primary) hypertension: Secondary | ICD-10-CM | POA: Diagnosis not present

## 2016-06-15 DIAGNOSIS — C9101 Acute lymphoblastic leukemia, in remission: Secondary | ICD-10-CM | POA: Diagnosis not present

## 2016-06-15 DIAGNOSIS — Z452 Encounter for adjustment and management of vascular access device: Secondary | ICD-10-CM | POA: Diagnosis not present

## 2016-09-14 DIAGNOSIS — I10 Essential (primary) hypertension: Secondary | ICD-10-CM | POA: Diagnosis not present

## 2016-09-14 DIAGNOSIS — R21 Rash and other nonspecific skin eruption: Secondary | ICD-10-CM | POA: Diagnosis not present

## 2016-09-14 DIAGNOSIS — Z79899 Other long term (current) drug therapy: Secondary | ICD-10-CM | POA: Diagnosis not present

## 2016-09-14 DIAGNOSIS — C9101 Acute lymphoblastic leukemia, in remission: Secondary | ICD-10-CM | POA: Diagnosis not present

## 2016-12-21 DIAGNOSIS — C9101 Acute lymphoblastic leukemia, in remission: Secondary | ICD-10-CM | POA: Diagnosis not present

## 2017-02-18 ENCOUNTER — Other Ambulatory Visit: Payer: Self-pay | Admitting: Family Medicine

## 2017-02-18 DIAGNOSIS — Z1231 Encounter for screening mammogram for malignant neoplasm of breast: Secondary | ICD-10-CM

## 2017-03-04 DIAGNOSIS — Z0000 Encounter for general adult medical examination without abnormal findings: Secondary | ICD-10-CM | POA: Diagnosis not present

## 2017-03-04 DIAGNOSIS — R739 Hyperglycemia, unspecified: Secondary | ICD-10-CM | POA: Diagnosis not present

## 2017-03-04 DIAGNOSIS — I10 Essential (primary) hypertension: Secondary | ICD-10-CM | POA: Diagnosis not present

## 2017-03-22 DIAGNOSIS — I871 Compression of vein: Secondary | ICD-10-CM | POA: Diagnosis not present

## 2017-03-22 DIAGNOSIS — I10 Essential (primary) hypertension: Secondary | ICD-10-CM | POA: Diagnosis not present

## 2017-03-22 DIAGNOSIS — C9101 Acute lymphoblastic leukemia, in remission: Secondary | ICD-10-CM | POA: Diagnosis not present

## 2017-03-22 DIAGNOSIS — L982 Febrile neutrophilic dermatosis [Sweet]: Secondary | ICD-10-CM | POA: Diagnosis not present

## 2017-03-26 ENCOUNTER — Ambulatory Visit
Admission: RE | Admit: 2017-03-26 | Discharge: 2017-03-26 | Disposition: A | Discharge status: Home / Self Care | Payer: BLUE CROSS/BLUE SHIELD | Source: Ambulatory Visit | Attending: Family Medicine | Admitting: Family Medicine

## 2017-03-26 DIAGNOSIS — Z1231 Encounter for screening mammogram for malignant neoplasm of breast: Secondary | ICD-10-CM | POA: Diagnosis not present

## 2017-06-21 DIAGNOSIS — Z95828 Presence of other vascular implants and grafts: Secondary | ICD-10-CM | POA: Diagnosis not present

## 2017-06-21 DIAGNOSIS — C9101 Acute lymphoblastic leukemia, in remission: Secondary | ICD-10-CM | POA: Diagnosis not present

## 2017-06-21 DIAGNOSIS — I10 Essential (primary) hypertension: Secondary | ICD-10-CM | POA: Diagnosis not present

## 2017-06-21 DIAGNOSIS — R21 Rash and other nonspecific skin eruption: Secondary | ICD-10-CM | POA: Diagnosis not present

## 2017-06-21 DIAGNOSIS — Z9221 Personal history of antineoplastic chemotherapy: Secondary | ICD-10-CM | POA: Diagnosis not present

## 2017-08-05 DIAGNOSIS — Z23 Encounter for immunization: Secondary | ICD-10-CM | POA: Diagnosis not present

## 2017-09-13 DIAGNOSIS — I10 Essential (primary) hypertension: Secondary | ICD-10-CM | POA: Diagnosis not present

## 2017-09-13 DIAGNOSIS — D72819 Decreased white blood cell count, unspecified: Secondary | ICD-10-CM | POA: Diagnosis not present

## 2017-09-13 DIAGNOSIS — C9101 Acute lymphoblastic leukemia, in remission: Secondary | ICD-10-CM | POA: Diagnosis not present

## 2017-09-13 DIAGNOSIS — N939 Abnormal uterine and vaginal bleeding, unspecified: Secondary | ICD-10-CM | POA: Diagnosis not present

## 2018-02-17 DIAGNOSIS — J45901 Unspecified asthma with (acute) exacerbation: Secondary | ICD-10-CM | POA: Diagnosis not present

## 2018-02-17 DIAGNOSIS — R030 Elevated blood-pressure reading, without diagnosis of hypertension: Secondary | ICD-10-CM | POA: Diagnosis not present

## 2018-02-21 DIAGNOSIS — J45909 Unspecified asthma, uncomplicated: Secondary | ICD-10-CM | POA: Diagnosis not present

## 2018-02-24 ENCOUNTER — Other Ambulatory Visit: Payer: Self-pay | Admitting: Family Medicine

## 2018-02-24 DIAGNOSIS — Z1231 Encounter for screening mammogram for malignant neoplasm of breast: Secondary | ICD-10-CM

## 2018-03-28 DIAGNOSIS — Z1211 Encounter for screening for malignant neoplasm of colon: Secondary | ICD-10-CM | POA: Diagnosis not present

## 2018-03-28 DIAGNOSIS — D125 Benign neoplasm of sigmoid colon: Secondary | ICD-10-CM | POA: Diagnosis not present

## 2018-03-28 DIAGNOSIS — D122 Benign neoplasm of ascending colon: Secondary | ICD-10-CM | POA: Diagnosis not present

## 2018-03-28 DIAGNOSIS — D123 Benign neoplasm of transverse colon: Secondary | ICD-10-CM | POA: Diagnosis not present

## 2018-03-28 DIAGNOSIS — K6389 Other specified diseases of intestine: Secondary | ICD-10-CM | POA: Diagnosis not present

## 2018-03-31 ENCOUNTER — Ambulatory Visit
Admission: RE | Admit: 2018-03-31 | Discharge: 2018-03-31 | Disposition: A | Discharge status: Home / Self Care | Payer: BLUE CROSS/BLUE SHIELD | Source: Ambulatory Visit | Attending: Family Medicine | Admitting: Family Medicine

## 2018-03-31 DIAGNOSIS — Z1231 Encounter for screening mammogram for malignant neoplasm of breast: Secondary | ICD-10-CM | POA: Diagnosis not present

## 2018-04-01 DIAGNOSIS — R569 Unspecified convulsions: Secondary | ICD-10-CM | POA: Diagnosis not present

## 2018-04-01 DIAGNOSIS — R05 Cough: Secondary | ICD-10-CM | POA: Diagnosis not present

## 2018-04-01 DIAGNOSIS — R062 Wheezing: Secondary | ICD-10-CM | POA: Diagnosis not present

## 2018-04-01 DIAGNOSIS — L982 Febrile neutrophilic dermatosis [Sweet]: Secondary | ICD-10-CM | POA: Diagnosis not present

## 2018-04-01 DIAGNOSIS — I10 Essential (primary) hypertension: Secondary | ICD-10-CM | POA: Diagnosis not present

## 2018-04-01 DIAGNOSIS — C9101 Acute lymphoblastic leukemia, in remission: Secondary | ICD-10-CM | POA: Diagnosis not present

## 2018-04-02 DIAGNOSIS — C9101 Acute lymphoblastic leukemia, in remission: Secondary | ICD-10-CM | POA: Diagnosis not present

## 2018-04-02 DIAGNOSIS — R062 Wheezing: Secondary | ICD-10-CM | POA: Diagnosis not present

## 2018-04-28 DIAGNOSIS — J209 Acute bronchitis, unspecified: Secondary | ICD-10-CM | POA: Diagnosis not present

## 2018-04-28 DIAGNOSIS — H66004 Acute suppurative otitis media without spontaneous rupture of ear drum, recurrent, right ear: Secondary | ICD-10-CM | POA: Diagnosis not present

## 2018-04-28 DIAGNOSIS — R062 Wheezing: Secondary | ICD-10-CM | POA: Diagnosis not present

## 2018-05-19 DIAGNOSIS — Z0000 Encounter for general adult medical examination without abnormal findings: Secondary | ICD-10-CM | POA: Diagnosis not present

## 2018-09-22 DIAGNOSIS — Z23 Encounter for immunization: Secondary | ICD-10-CM | POA: Diagnosis not present

## 2018-10-10 DIAGNOSIS — C9101 Acute lymphoblastic leukemia, in remission: Secondary | ICD-10-CM | POA: Diagnosis not present

## 2019-04-01 ENCOUNTER — Other Ambulatory Visit: Payer: Self-pay | Admitting: Family Medicine

## 2019-04-01 DIAGNOSIS — Z1231 Encounter for screening mammogram for malignant neoplasm of breast: Secondary | ICD-10-CM

## 2019-05-18 ENCOUNTER — Ambulatory Visit
Admission: RE | Admit: 2019-05-18 | Discharge: 2019-05-18 | Disposition: A | Discharge status: Home / Self Care | Payer: BC Managed Care – PPO | Source: Ambulatory Visit | Attending: Family Medicine | Admitting: Family Medicine

## 2019-05-18 ENCOUNTER — Other Ambulatory Visit: Payer: Self-pay

## 2019-05-18 DIAGNOSIS — Z1231 Encounter for screening mammogram for malignant neoplasm of breast: Secondary | ICD-10-CM | POA: Diagnosis not present

## 2019-06-05 DIAGNOSIS — C9101 Acute lymphoblastic leukemia, in remission: Secondary | ICD-10-CM | POA: Diagnosis not present

## 2019-06-05 DIAGNOSIS — I10 Essential (primary) hypertension: Secondary | ICD-10-CM | POA: Diagnosis not present

## 2019-06-05 DIAGNOSIS — L982 Febrile neutrophilic dermatosis [Sweet]: Secondary | ICD-10-CM | POA: Diagnosis not present

## 2019-06-05 DIAGNOSIS — Z9221 Personal history of antineoplastic chemotherapy: Secondary | ICD-10-CM | POA: Diagnosis not present

## 2019-06-09 DIAGNOSIS — Z0000 Encounter for general adult medical examination without abnormal findings: Secondary | ICD-10-CM | POA: Diagnosis not present

## 2019-06-09 DIAGNOSIS — E6609 Other obesity due to excess calories: Secondary | ICD-10-CM | POA: Diagnosis not present

## 2019-06-09 DIAGNOSIS — Z6832 Body mass index (BMI) 32.0-32.9, adult: Secondary | ICD-10-CM | POA: Diagnosis not present

## 2019-06-09 DIAGNOSIS — I10 Essential (primary) hypertension: Secondary | ICD-10-CM | POA: Diagnosis not present

## 2019-06-09 DIAGNOSIS — D126 Benign neoplasm of colon, unspecified: Secondary | ICD-10-CM | POA: Diagnosis not present

## 2019-06-09 DIAGNOSIS — R739 Hyperglycemia, unspecified: Secondary | ICD-10-CM | POA: Diagnosis not present

## 2019-06-25 DIAGNOSIS — R109 Unspecified abdominal pain: Secondary | ICD-10-CM | POA: Diagnosis not present

## 2020-01-14 ENCOUNTER — Ambulatory Visit: Payer: Self-pay | Attending: Internal Medicine

## 2020-01-15 ENCOUNTER — Ambulatory Visit: Payer: Self-pay | Attending: Internal Medicine

## 2020-01-15 DIAGNOSIS — Z23 Encounter for immunization: Secondary | ICD-10-CM

## 2020-01-15 NOTE — Progress Notes (Signed)
   Covid-19 Vaccination Clinic  Name:  Samantha Oconnor    MRN: [id number] DOB: October 02, 1966  01/15/2020  [name] was observed post Covid-19 immunization for 15 minutes without incident. She was provided with Vaccine Information Sheet and instruction to access the V-Safe system.   [name] was instructed to call 911 with any severe reactions post vaccine: Marland Kitchen Difficulty breathing  . Swelling of face and throat  . A fast heartbeat  . A bad rash all over body  . Dizziness and weakness   Immunizations Administered    Name Date Dose VIS Date Route   Pfizer COVID-19 Vaccine 01/15/2020 12:01 PM 0.3 mL 10/09/2019 Intramuscular   Manufacturer: [place]   Lot: [id number]   [place]ak: [id number]

## 2020-02-09 ENCOUNTER — Ambulatory Visit: Payer: Self-pay | Attending: Internal Medicine

## 2020-02-09 DIAGNOSIS — Z23 Encounter for immunization: Secondary | ICD-10-CM

## 2020-02-09 NOTE — Progress Notes (Signed)
   Covid-19 Vaccination Clinic  Name:  Samantha Oconnor    MRN: [id number] DOB: 21-Aug-1966  02/09/2020  [name] was observed post Covid-19 immunization for 15 minutes without incident. She was provided with Vaccine Information Sheet and instruction to access the V-Safe system.   [name] was instructed to call 911 with any severe reactions post vaccine: Marland Kitchen Difficulty breathing  . Swelling of face and throat  . A fast heartbeat  . A bad rash all over body  . Dizziness and weakness   Immunizations Administered    Name Date Dose VIS Date Route   Pfizer COVID-19 Vaccine 02/09/2020 12:49 PM 0.3 mL 10/09/2019 Intramuscular   Manufacturer: [place]   Lot: [id number]   Craig: [id number]

## 2020-11-26 IMAGING — MG DIGITAL SCREENING BILATERAL MAMMOGRAM WITH TOMO AND CAD
8 series · 8 of 24 positions shown · non-contrast
Comparison: Previous exam(s).

CLINICAL DATA: Screening.

EXAM:
DIGITAL SCREENING BILATERAL MAMMOGRAM WITH TOMO AND CAD

[L CC synth-2D]
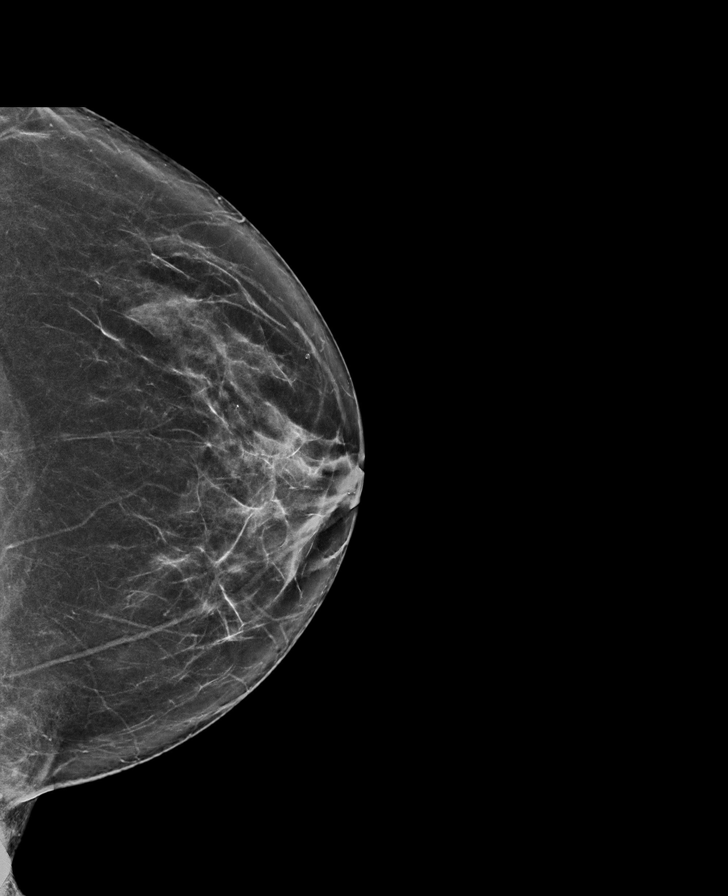

[R CC synth-2D]
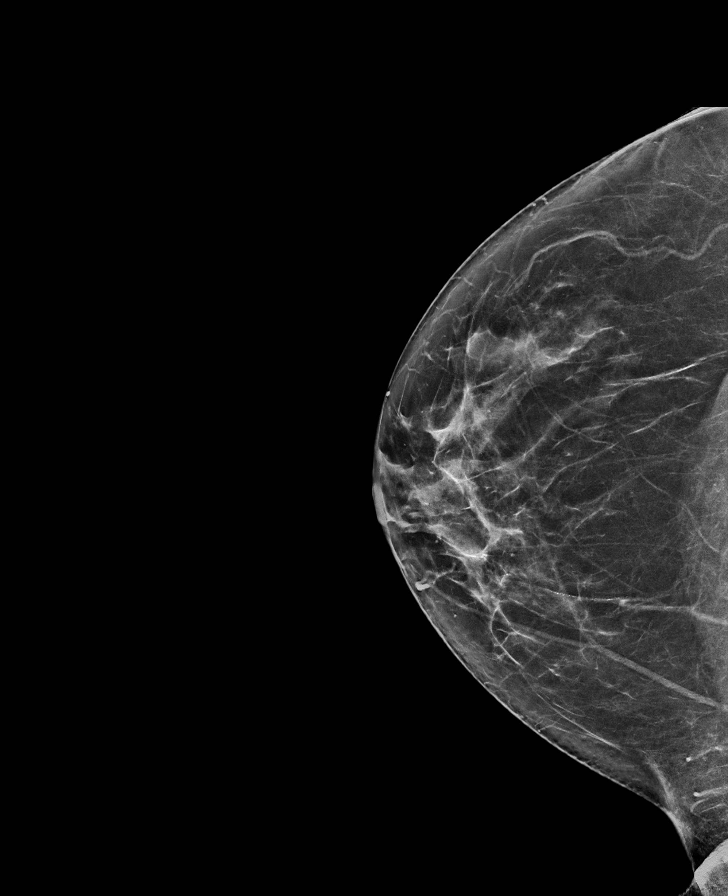

[L MLO synth-2D]
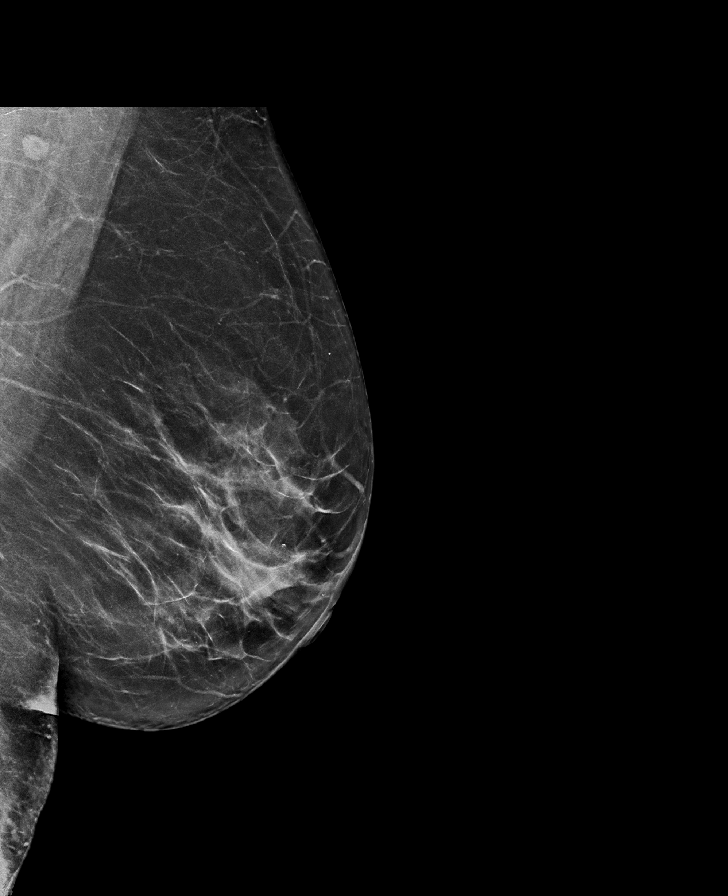

[R MLO synth-2D]
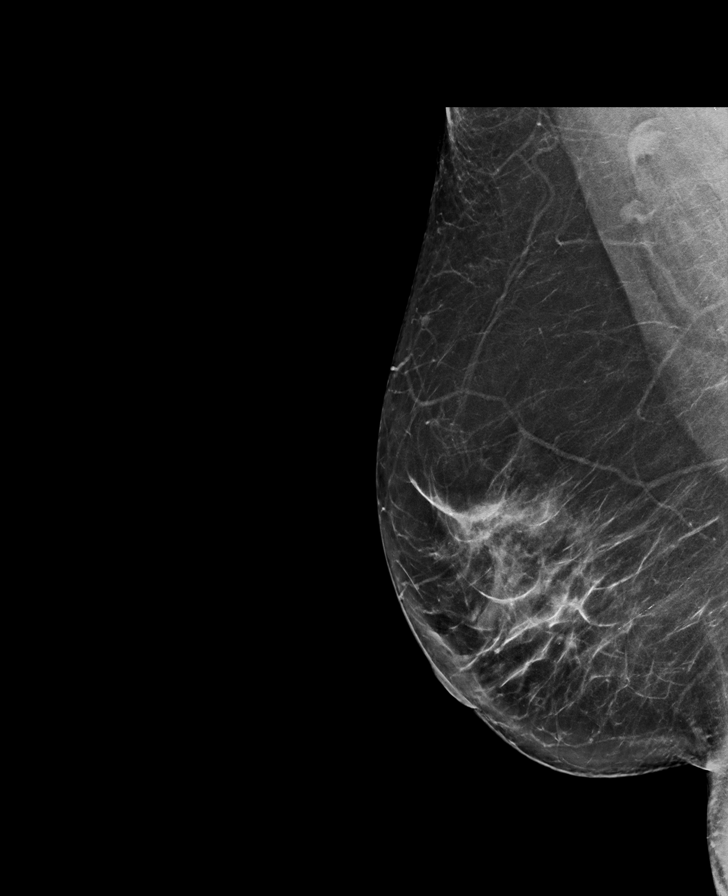

[L CC tomo · tomo slice 37/73.0]
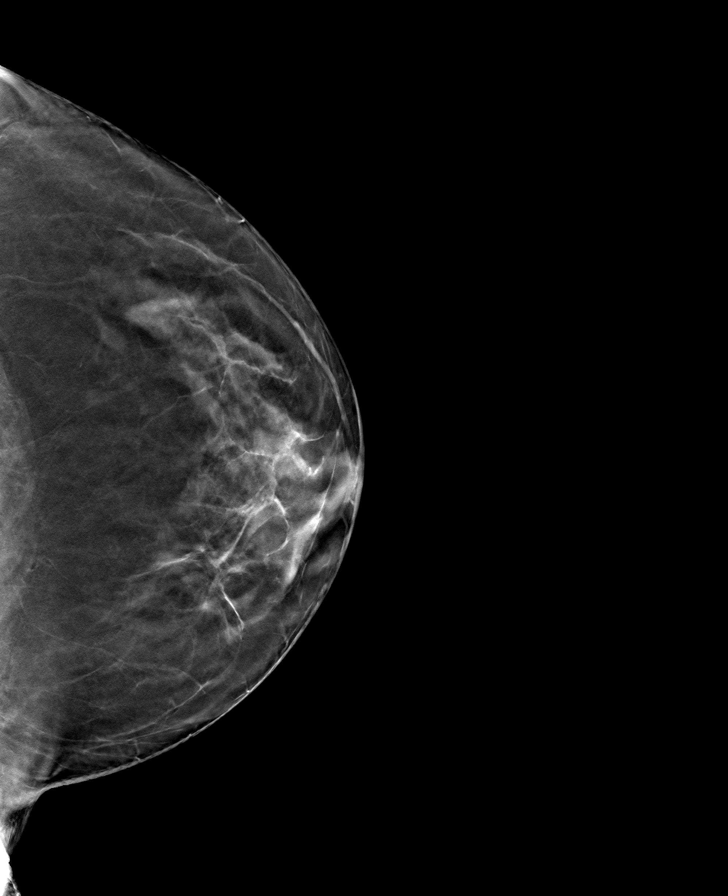

[L MLO tomo · tomo slice 41/80.0]
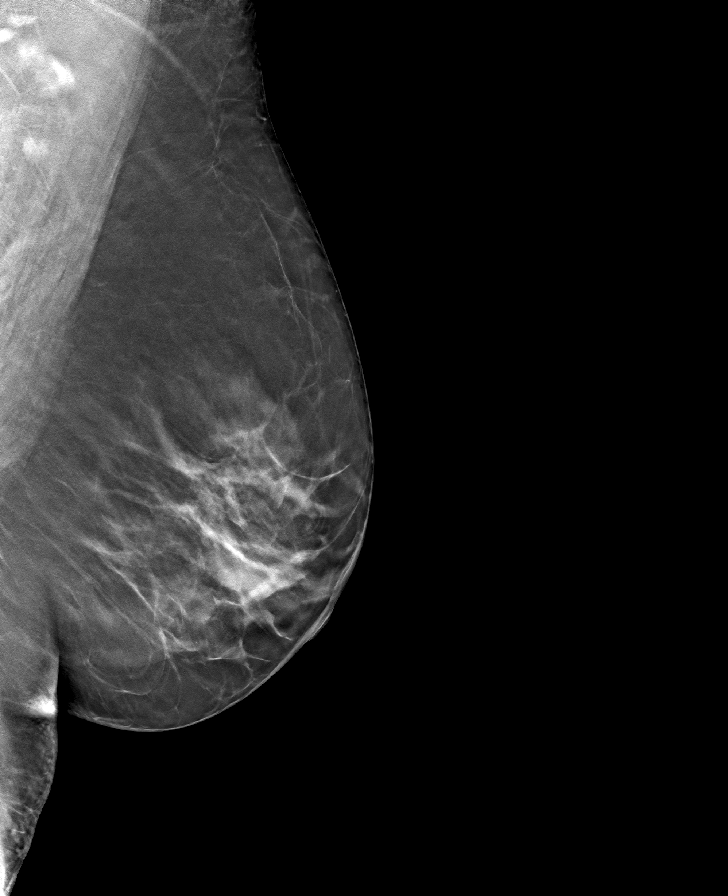

[R CC tomo · tomo slice 37/74.0]
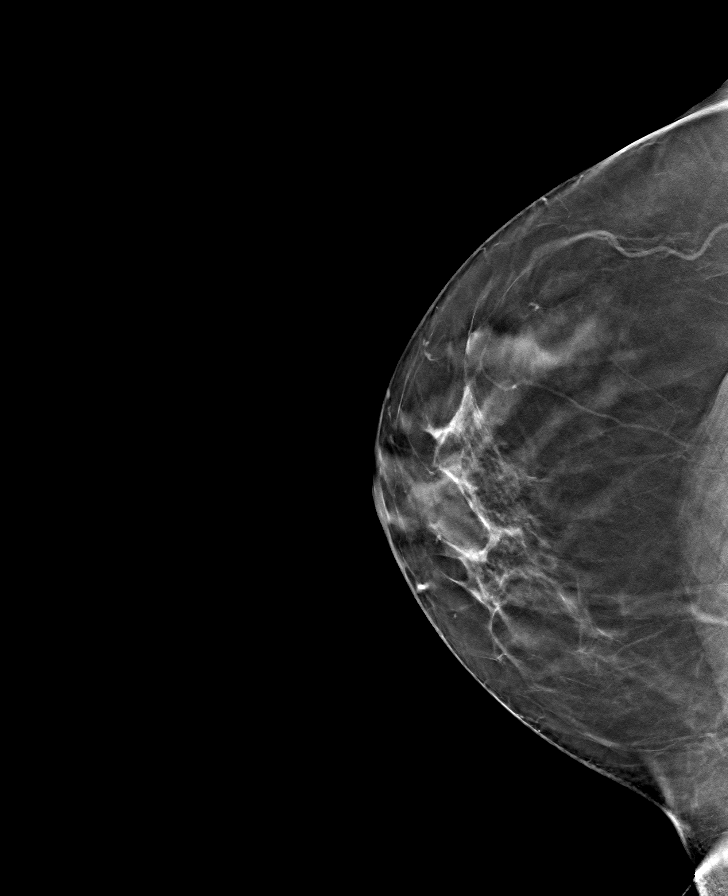

[R MLO tomo · tomo slice 43/85.0]
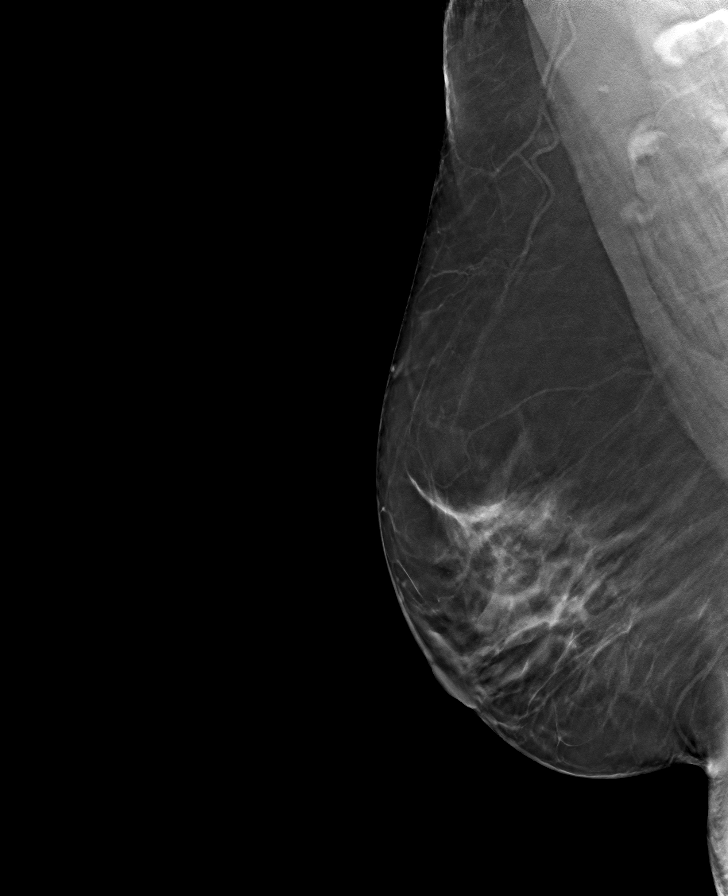

[8 of 24 positions shown; findings below may reference images not displayed]

ACR Breast Density Category b: There are scattered areas of
fibroglandular density.
FINDINGS: There are no findings suspicious for malignancy. Images were
processed with CAD.
IMPRESSION: No mammographic evidence of malignancy. A result letter of this
screening mammogram will be mailed directly to the patient.

RECOMMENDATION:
Screening mammogram in one year. (Code:[id number])

BI-RADS CATEGORY  1: Negative.
